# Patient Record
Sex: Male | Born: 1995 | Race: White | Hispanic: No | Marital: Single | Smoking: Former smoker
Health system: Southern US, Community
[De-identification: ages and names within clinical notes are randomized; demographics above are authoritative.]

## PROBLEM LIST (undated history)

## (undated) DIAGNOSIS — F909 Attention-deficit hyperactivity disorder, unspecified type: Secondary | ICD-10-CM

## (undated) DIAGNOSIS — R278 Other lack of coordination: Secondary | ICD-10-CM

## (undated) DIAGNOSIS — F88 Other disorders of psychological development: Secondary | ICD-10-CM

## (undated) DIAGNOSIS — IMO0001 Reserved for inherently not codable concepts without codable children: Secondary | ICD-10-CM

## (undated) DIAGNOSIS — T7840XA Allergy, unspecified, initial encounter: Secondary | ICD-10-CM

## (undated) DIAGNOSIS — F419 Anxiety disorder, unspecified: Secondary | ICD-10-CM

## (undated) HISTORY — DX: Other lack of coordination: R27.8

## (undated) HISTORY — DX: Allergy, unspecified, initial encounter: T78.40XA

## (undated) HISTORY — DX: Other disorders of psychological development: F88

## (undated) HISTORY — DX: Reserved for inherently not codable concepts without codable children: IMO0001

## (undated) HISTORY — DX: Attention-deficit hyperactivity disorder, unspecified type: F90.9

---
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
## 2017-07-28 ENCOUNTER — Encounter: Payer: Self-pay | Admitting: Family Medicine

## 2017-07-28 ENCOUNTER — Ambulatory Visit (INDEPENDENT_AMBULATORY_CARE_PROVIDER_SITE_OTHER): Payer: Medicaid Other | Admitting: Family Medicine

## 2017-07-28 DIAGNOSIS — M549 Dorsalgia, unspecified: Secondary | ICD-10-CM | POA: Diagnosis not present
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
## 2017-12-23 ENCOUNTER — Other Ambulatory Visit: Payer: Self-pay | Admitting: Family Medicine

## 2017-12-23 MED ORDER — OSELTAMIVIR PHOSPHATE 75 MG PO CAPS: 75.0000 mg | ORAL_CAPSULE | Freq: Every day | ORAL | 0 refills | Status: DC

## 2019-03-02 DIAGNOSIS — Z0389 Encounter for observation for other suspected diseases and conditions ruled out: Secondary | ICD-10-CM | POA: Diagnosis not present

## 2019-03-02 DIAGNOSIS — Z3009 Encounter for other general counseling and advice on contraception: Secondary | ICD-10-CM | POA: Diagnosis not present

## 2019-03-02 DIAGNOSIS — Z1388 Encounter for screening for disorder due to exposure to contaminants: Secondary | ICD-10-CM | POA: Diagnosis not present

## 2020-11-18 ENCOUNTER — Emergency Department (HOSPITAL_COMMUNITY)
Admission: EM | Admit: 2020-11-18 | Discharge: 2020-11-18 | Disposition: A | Discharge status: Home / Self Care | Payer: Medicaid Other | Attending: Emergency Medicine | Admitting: Emergency Medicine

## 2020-11-18 ENCOUNTER — Encounter (HOSPITAL_COMMUNITY): Payer: Self-pay | Admitting: Emergency Medicine

## 2020-11-18 ENCOUNTER — Other Ambulatory Visit: Payer: Self-pay

## 2020-11-18 DIAGNOSIS — R0789 Other chest pain: Secondary | ICD-10-CM | POA: Diagnosis not present

## 2020-11-18 DIAGNOSIS — J45909 Unspecified asthma, uncomplicated: Secondary | ICD-10-CM | POA: Diagnosis not present

## 2020-11-18 DIAGNOSIS — T887XXA Unspecified adverse effect of drug or medicament, initial encounter: Secondary | ICD-10-CM | POA: Diagnosis not present

## 2020-11-18 DIAGNOSIS — F17200 Nicotine dependence, unspecified, uncomplicated: Secondary | ICD-10-CM | POA: Diagnosis not present

## 2020-11-18 DIAGNOSIS — T50904A Poisoning by unspecified drugs, medicaments and biological substances, undetermined, initial encounter: Secondary | ICD-10-CM | POA: Diagnosis not present

## 2020-11-18 DIAGNOSIS — R002 Palpitations: Secondary | ICD-10-CM | POA: Diagnosis present

## 2020-11-18 DIAGNOSIS — F14129 Cocaine abuse with intoxication, unspecified: Secondary | ICD-10-CM | POA: Insufficient documentation

## 2020-11-18 DIAGNOSIS — R0689 Other abnormalities of breathing: Secondary | ICD-10-CM | POA: Diagnosis not present

## 2020-11-18 MED ORDER — LORAZEPAM 2 MG/ML IJ SOLN
1.0000 mg | Freq: Once | INTRAMUSCULAR | Status: AC
  Administered 2020-11-18: 1 mg via INTRAVENOUS
  Filled 2020-11-18: qty 1

## 2020-11-18 NOTE — ED Triage Notes (Signed)
[facility] - pt c/o tachycardia and emesis after snorting about 1g of cocaine today.

## 2020-11-18 NOTE — Discharge Instructions (Addendum)
You were evaluated in the Emergency Department and after careful evaluation, we did not find any emergent condition requiring admission or further testing in the hospital.  Your exam/testing today was overall reassuring.  Please return to the Emergency Department if you experience any worsening of your condition.  Thank you for allowing us to be a part of your care.  

## 2020-11-18 NOTE — ED Provider Notes (Signed)
[facility]-EMERGENCY DEPT [facility] Emergency Department Provider Note MRN:  [id number]  Arrival date & time: 11/18/20     Chief Complaint   Ingestion   History of Present Illness   Leonard Craig is a 24 y.o. year-old male with no pertinent past medical presenting to the ED with chief complaint of ingestion.  Patient explains that he used cocaine today and it is making him feel strange.  He used 1 g of cocaine from typical supplier, has been known to use more than 1 g.  Endorsing heart racing and nausea vomiting after use today, which is abnormal for him denies chest pain or shortness of breath.  Feels anxious.  Review of Systems  A complete 10 system review of systems was obtained and all systems are negative except as noted in the HPI and PMH.   Patient's Health History    Past Medical History:  Diagnosis Date  . ADHD (attention deficit hyperactivity disorder)   . Allergy   . Anxiety   . Asthma 03/28/2013   Pt. States resolved  . Dysgraphia   . Sensory integration disorder   . Speech-language therapy     History reviewed. No pertinent surgical history.  Family History  Problem Relation Age of Onset  . Alcohol abuse Mother   . Depression Mother   . Kidney disease Father   . Kidney disease Maternal Grandmother     Social History   Socioeconomic History  . Marital status: Single    Spouse name: Not on file  . Number of children: Not on file  . Years of education: Not on file  . Highest education level: Not on file  Occupational History  . Not on file  Tobacco Use  . Smoking status: Current Some Day Smoker  . Smokeless tobacco: Never Used  Substance and Sexual Activity  . Alcohol use: Yes  . Drug use: Yes    Types: Marijuana, Cocaine    Comment: QD  . Sexual activity: Never    Comment: states he was in the car with people who were "hot boxing"  Other Topics Concern  . Not on file  Social History Narrative  . Not on file   Social Determinants of Health     Financial Resource Strain:   . Difficulty of Paying Living Expenses: Not on file  Food Insecurity:   . Worried About Programme researcher, broadcasting/film/video in the Last Year: Not on file  . Ran Out of Food in the Last Year: Not on file  Transportation Needs:   . Lack of Transportation (Medical): Not on file  . Lack of Transportation (Non-Medical): Not on file  Physical Activity:   . Days of Exercise per Week: Not on file  . Minutes of Exercise per Session: Not on file  Stress:   . Feeling of Stress : Not on file  Social Connections:   . Frequency of Communication with Friends and Family: Not on file  . Frequency of Social Gatherings with Friends and Family: Not on file  . Attends Religious Services: Not on file  . Active Member of Clubs or Organizations: Not on file  . Attends Banker Meetings: Not on file  . Marital Status: Not on file  Intimate Partner Violence:   . Fear of Current or Ex-Partner: Not on file  . Emotionally Abused: Not on file  . Physically Abused: Not on file  . Sexually Abused: Not on file     Physical Exam   Vitals:  11/18/20 2157  BP: 140/88  Pulse: 88  Resp: 18  Temp: 98.4 F (36.9 C)  SpO2: 95%    CONSTITUTIONAL: Well-appearing, NAD NEURO:  Alert and oriented x 3, no focal deficits EYES:  eyes equal and reactive ENT/NECK:  no LAD, no JVD CARDIO: Regular rate, well-perfused, normal S1 and S2 PULM:  CTAB no wheezing or rhonchi GI/GU:  normal bowel sounds, non-distended, non-tender MSK/SPINE:  No gross deformities, no edema SKIN:  no rash, atraumatic PSYCH:  Appropriate speech and behavior  *Additional and/or pertinent findings included in MDM below  Diagnostic and Interventional Summary    EKG Interpretation  Date/Time:  Tuesday November 18 2020 22:40:52 EST Ventricular Rate:  81 PR Interval:  172 QRS Duration: 72 QT Interval:  390 QTC Calculation: 453 R Axis:   90 Text Interpretation:  Poor data quality, interpretation may be  adversely affected Sinus rhythm with marked sinus arrhythmia Rightward axis Anteroseptal infarct , age undetermined Abnormal ECG Confirmed by Kennis Carina [phone]) on 11/18/2020 11:05:09 PM      Labs Reviewed - No data to display  No orders to display    Medications  LORazepam (ATIVAN) injection 1 mg (1 mg Intravenous Given 11/18/20 2246)     Procedures  /  Critical Care Procedures  ED Course and Medical Decision Making  I have reviewed the triage vital signs, the nursing notes, and pertinent available records from the EMR.  Listed above are laboratory and imaging tests that I personally ordered, reviewed, and interpreted and then considered in my medical decision making (see below for details).  Patient is in no acute distress with normal vital signs after cocaine use.  Dilated pupils.  No chest pain or shortness of breath.  EKG is reassuring.  Patient feeling much better after IV Ativan.  Requesting discharge.  Doubt emergent process, appropriate for discharge.  Patient encouraged to refrain from cocaine use in the future.       Elmer Sow. Pilar Plate, MD [facility] Emergency Medicine [facility] [email or web address]  Final Clinical Impressions(s) / ED Diagnoses     ICD-10-CM   1. Cocaine abuse with intoxication (HCC)  F81.829     ED Discharge Orders    None       Discharge Instructions Discussed with and Provided to Patient:     Discharge Instructions     You were evaluated in the Emergency Department and after careful evaluation, we did not find any emergent condition requiring admission or further testing in the hospital.  Your exam/testing today was overall reassuring.  Please return to the Emergency Department if you experience any worsening of your condition.  Thank you for allowing [place] to be a part of your care.       Sabas Sous, MD 11/18/20 2340

## 2020-11-19 ENCOUNTER — Telehealth: Payer: Self-pay

## 2020-11-19 NOTE — Telephone Encounter (Signed)
Transition Care Management Unsuccessful Follow-up Telephone Call ° °Date of discharge and from where:  11/18/2020 [facility] ° °Attempts:  1st Attempt ° °Reason for unsuccessful TCM follow-up call:  Voice mail full ° ° ° °

## 2020-11-20 NOTE — Telephone Encounter (Signed)
Transition Care Management Unsuccessful Follow-up Telephone Call  Date of discharge and from where:  11/18/2020 [facility]  Attempts:  2nd Attempt  Reason for unsuccessful TCM follow-up call:  Voice mail full

## 2020-11-21 NOTE — Telephone Encounter (Signed)
Transition Care Management Unsuccessful Follow-up Telephone Call  Date of discharge and from where:  11/18/2020 [facility]  Attempts:  3rd Attempt  Reason for unsuccessful TCM follow-up call:  Voice mail full

## 2021-04-02 ENCOUNTER — Other Ambulatory Visit: Payer: Self-pay

## 2021-04-02 ENCOUNTER — Ambulatory Visit: Payer: Medicaid Other | Admitting: Family Medicine

## 2021-04-02 ENCOUNTER — Encounter: Payer: Self-pay | Admitting: Family Medicine

## 2021-04-02 VITALS — BP 122/84 | HR 85 | Temp 98.6°F | Ht 69.0 in | Wt 178.4 lb

## 2021-04-02 DIAGNOSIS — F902 Attention-deficit hyperactivity disorder, combined type: Secondary | ICD-10-CM | POA: Diagnosis not present

## 2021-04-02 DIAGNOSIS — Z0000 Encounter for general adult medical examination without abnormal findings: Secondary | ICD-10-CM | POA: Diagnosis not present

## 2021-04-02 DIAGNOSIS — M940 Chondrocostal junction syndrome [Tietze]: Secondary | ICD-10-CM | POA: Diagnosis not present

## 2021-04-02 DIAGNOSIS — Z2821 Immunization not carried out because of patient refusal: Secondary | ICD-10-CM | POA: Diagnosis not present

## 2021-04-02 DIAGNOSIS — F1210 Cannabis abuse, uncomplicated: Secondary | ICD-10-CM

## 2021-04-02 DIAGNOSIS — G43009 Migraine without aura, not intractable, without status migrainosus: Secondary | ICD-10-CM

## 2021-04-02 DIAGNOSIS — F325 Major depressive disorder, single episode, in full remission: Secondary | ICD-10-CM

## 2021-04-02 NOTE — Progress Notes (Signed)
   Subjective:    Patient ID: Leonard Craig, male    DOB: 06/04/1996, 25 y.o.   MRN: [id number]  HPI He is here for complete examination.  He does complain of a 3-week history of right-sided costal chondral pain he states today is much better.  No history of injury to that area or overuse of the arms chest or back.  He also has a history of cocaine abuse but states he is no longer using this but has switched to marijuana.  He has not gotten involved in AA. he does have a previous history of depression and has been on Remeron and Prozac however is not taking it at present time.  He states that psychologically he feels quite good.  He is in a stable relationship.  He does not drink and does not smoke cigarettes.  Does have a previous history of ADHD as well as migraine headaches but is not on any medications or had difficulty with migraines.  Family and social history as well as health maintenance and immunizations was reviewed  Review of Systems  All other systems reviewed and are negative.      Objective:   Physical Exam Alert and in no distress. Tympanic membranes and canals are normal. Pharyngeal area is normal. Neck is supple without adenopathy or thyromegaly. Cardiac exam shows a regular sinus rhythm without murmurs or gallops. Lungs are clear to auscultation. Abdominal exam shows no masses or tenderness with normal bowel sounds.  Genitalia shows normal circumcised male testes normal.       Assessment & Plan:  Routine general medical examination at a health care facility  Costochondritis, acute  Migraine without aura and without status migrainosus, not intractable  ADHD (attention deficit hyperactivity disorder), combined type  Cannabis abuse  Major depression in remission [facility])  Immunization refused Covid vaccination was recommended however he refused.  He also did not want any blood work drawn.  No intervention needed for the migraine or ADHD. For treatment of a  costochondritis I mentioned using heat for 20 minutes 3 times per day,, 2 Aleve twice per day for the next 10 days. I then discussed his use of marijuana and the fact that he is using it for mind altering properties.  Strongly encouraged him to get involved in counseling to deal with the underlying issues as to why he is using the marijuana.  phone number for [place] [facility] was given.

## 2023-04-27 ENCOUNTER — Telehealth: Payer: Self-pay

## 2023-04-27 NOTE — Telephone Encounter (Signed)
LVM for patient to call back. AS, CMA 

## 2023-11-11 ENCOUNTER — Ambulatory Visit
Admission: RE | Admit: 2023-11-11 | Discharge: 2023-11-11 | Disposition: A | Discharge status: Home / Self Care | Payer: Medicaid Other | Source: Ambulatory Visit | Attending: Medical | Admitting: Medical

## 2023-11-11 ENCOUNTER — Ambulatory Visit (INDEPENDENT_AMBULATORY_CARE_PROVIDER_SITE_OTHER): Payer: Medicaid Other | Admitting: Medical

## 2023-11-11 ENCOUNTER — Encounter: Payer: Self-pay | Admitting: Medical

## 2023-11-11 VITALS — BP 120/70 | HR 77 | Temp 97.8°F | Wt 188.8 lb

## 2023-11-11 DIAGNOSIS — M5442 Lumbago with sciatica, left side: Secondary | ICD-10-CM

## 2023-11-11 DIAGNOSIS — M5410 Radiculopathy, site unspecified: Secondary | ICD-10-CM

## 2023-11-11 DIAGNOSIS — R109 Unspecified abdominal pain: Secondary | ICD-10-CM | POA: Diagnosis not present

## 2023-11-11 DIAGNOSIS — M51361 Other intervertebral disc degeneration, lumbar region with lower extremity pain only: Secondary | ICD-10-CM | POA: Diagnosis not present

## 2023-11-11 DIAGNOSIS — N50819 Testicular pain, unspecified: Secondary | ICD-10-CM | POA: Diagnosis not present

## 2023-11-11 DIAGNOSIS — R1030 Lower abdominal pain, unspecified: Secondary | ICD-10-CM | POA: Diagnosis not present

## 2023-11-11 MED ORDER — OXYCODONE-ACETAMINOPHEN 5-325 MG PO TABS: 1.0000 | ORAL_TABLET | Freq: Three times a day (TID) | ORAL | 0 refills | Status: DC | PRN

## 2023-11-11 MED ORDER — PREDNISONE 10 MG PO TABS: ORAL_TABLET | ORAL | 0 refills | Status: DC

## 2023-11-11 MED ORDER — DIAZEPAM 5 MG PO TABS: 5.0000 mg | ORAL_TABLET | Freq: Two times a day (BID) | ORAL | 0 refills | Status: DC | PRN

## 2023-11-11 NOTE — Patient Instructions (Signed)
Recommendations: Go for x-ray today Begin prednisone steroid taper as directed on the label over the next week to reduce pain and inflammation Begin Percocet pain medication 1 tablet every 6-8 hours as needed over the weekend for pain.  Caution this can cause drowsiness You could also use Valium for muscle relaxer up to twice a day through the weekend but caution as this can also cause sedation.  Try not to take this at the same time as the pain medication.  Alternate at least 4 hours. Use the urine strainer and urine cup to strain your urine in the event there could be a kidney stone.  If you get stone debris put in the urine cup and plan to bring it in next week Over the weekend if you are way worse despite the medication, severe pain, fever, blood in the urine, uncontrollable vomiting, cannot walk, numbness in the genitals, then go to the emergency department.   Please go to [facility] for your back and KUB xray.   Their hours are 8am - 4:30 pm Monday - Friday.  Take your insurance card with you.  [facility] [phone]   [address] [place], [place] [address]

## 2023-11-11 NOTE — Progress Notes (Signed)
Subjective:  Leonard Craig is a 27 y.o. male who presents for Chief Complaint  Patient presents with   Back Pain    Back pain, lower back and love handles. Doing something the other day and started back spasms- and back pain is radiating down into left butt cheek and into testicles.      Here for back pain.  Having unusual pains in low back.    Has hx/o back pain.  Works in [employer] moving in general, so has pains on a regular basis.   His new paint radiates down into testicles and into left thigh.  Started 5 days ago.  This pain is new/worse than usual pains he gets on the job.  Has also been having back spasms in upper back.  Last night couldn't sleep due to the pain.    No recent activity out of the ordinary, so no reason to have worse pain.  He notes last night had nausea, felt like he had to use bathroom, tried to have BM, nothing would happen.  Started having bad gas last night as well, despite light dinner.  No urinary changes, no frequency or urgent, no penile discharge, no blood in urine.   Has had some occasional blood in stool he attributes to hemorrhoids.  Has had some testicular pain in past but not frequent  Has girlfriend, been together 11 years, no concern for STD.  No prior prostate or urinary infection.  Has been using Tylenol, Advil, heat, cold therapy, lidocaine cream.     Tried stretching.  No numbness in genital.    No other aggravating or relieving factors.    No other c/o.  Past Medical History:  Diagnosis Date   ADHD (attention deficit hyperactivity disorder)    Allergy    Anxiety    Asthma 03/28/2013   Pt. States resolved   Dysgraphia    Sensory integration disorder    Speech-language therapy    No current outpatient medications on file prior to visit.   No current facility-administered medications on file prior to visit.     The following portions of the patient's history were reviewed and updated as appropriate: allergies, current  medications, past family history, past medical history, past social history, past surgical history and problem list.  ROS Otherwise as in subjective above    Objective: BP 120/70   Pulse 77   Temp 97.8 F (36.6 C)   Wt 188 lb 12.8 oz (85.6 kg)   BMI 27.88 kg/m   General appearance: alert,, well developed, well nourished, seems somewhat distressed in pain Abdomen, soft, left lower and left mid abdominal tenderness, no mass, no  hepatomegaly, no splenomegaly Back: Tender lumbar paraspinal bilateral and midline lumbar region, range of motion about 80 degrees with flexion 10 degrees extension but in pain, no obvious swelling or discoloration MSK: Left hip internal range of motion elicits some pain in the back, straight leg raise positive on the left, both legs in the spasm with straight leg raise Strength normal but seems a little blunted 4-5 out of 5 with both legs, DTRs normal, sensation normal GU: Normal male, circumcised, tender the left testicle but no swelling or deformity, no hernia, no lymphadenopathy Pulses: 2+ radial pulses, 2+ pedal pulses, normal cap refill Ext: no edema   Assessment: Encounter Diagnoses  Name Primary?   Left-sided low back pain with left-sided sciatica, unspecified chronicity Yes   Lower abdominal pain    Pain in testicle, unspecified laterality  Radicular pain of left lower extremity      Plan: We discussed symptoms, possible differential which could include ruptured disc, radicular pain from sciatica, versus kidney stone or other.  Discussed the following recommendations.  Discussed red flag symptoms over the weekend that would prompt going to the emergency department  Patient Instructions  Recommendations: Go for x-ray today Begin prednisone steroid taper as directed on the label over the next week to reduce pain and inflammation Begin Percocet pain medication 1 tablet every 6-8 hours as needed over the weekend for pain.  Caution this can  cause drowsiness You could also use Valium for muscle relaxer up to twice a day through the weekend but caution as this can also cause sedation.  Try not to take this at the same time as the pain medication.  Alternate at least 4 hours. Use the urine strainer and urine cup to strain your urine in the event there could be a kidney stone.  If you get stone debris put in the urine cup and plan to bring it in next week Over the weekend if you are way worse despite the medication, severe pain, fever, blood in the urine, uncontrollable vomiting, cannot walk, numbness in the genitals, then go to the emergency department.   Please go to [facility] for your back and KUB xray.   Their hours are 8am - 4:30 pm Monday - Friday.  Take your insurance card with you.  [facility] [phone]   [address] [place], [place] [address]   [name]" was seen today for back pain.  Diagnoses and all orders for this visit:  Left-sided low back pain with left-sided sciatica, unspecified chronicity -     DG Lumbar Spine Complete -     DG Abd 1 View  Lower abdominal pain -     DG Lumbar Spine Complete -     DG Abd 1 View  Pain in testicle, unspecified laterality -     DG Lumbar Spine Complete -     DG Abd 1 View  Radicular pain of left lower extremity -     DG Lumbar Spine Complete -     DG Abd 1 View  Other orders -     oxyCODONE-acetaminophen (PERCOCET/ROXICET) 5-325 MG tablet; Take 1 tablet by mouth every 8 (eight) hours as needed for up to 5 days for severe pain (pain score 7-10). -     predniSONE (DELTASONE) 10 MG tablet; 6 tablets all together day 1, 5 tablets day 2, 4 tablets day 3, 3 tablets day 4, 2 tablets day 5, 1 tablet day 6. -     diazepam (VALIUM) 5 MG tablet; Take 1 tablet (5 mg total) by mouth every 12 (twelve) hours as needed for anxiety.    Follow up: pending xray

## 2023-11-11 NOTE — Progress Notes (Signed)
X-ray shows no obvious kidney stone.  Abdomen x-ray otherwise normal  Back x-ray shows mild degenerative disc disease but also some disc height loss at L1-L2.  This could be related to bulging disc  Use the medication of the weekend, rest, hydrate well, try to find a comfortable position in the bed.  Do some mild stretching and gentle range of motion as symptoms improve  Plan on rechecking next week towards the end of the week  If symptoms are still quite persistent over the next week or 2, we may need to get an MRI or possibly get physical therapy involved

## 2023-11-14 ENCOUNTER — Telehealth: Payer: Self-pay | Admitting: Family Medicine

## 2023-11-14 ENCOUNTER — Other Ambulatory Visit: Payer: Self-pay | Admitting: Medical

## 2023-11-14 DIAGNOSIS — M5410 Radiculopathy, site unspecified: Secondary | ICD-10-CM

## 2023-11-14 DIAGNOSIS — M5441 Lumbago with sciatica, right side: Secondary | ICD-10-CM

## 2023-11-14 DIAGNOSIS — R202 Paresthesia of skin: Secondary | ICD-10-CM

## 2023-11-14 NOTE — Telephone Encounter (Signed)
Pt called & advised back pain a little better thinks due to prednisone & pain med, but left leg has sharp pain more than before & leg still numb & he still has testicle pain. He's also still having a hard time walking due to pain. He wants to know if you recommend he go ahead with the MRI?

## 2023-11-15 ENCOUNTER — Other Ambulatory Visit: Payer: Self-pay | Admitting: Medical

## 2023-11-15 ENCOUNTER — Telehealth: Payer: Self-pay | Admitting: Family Medicine

## 2023-11-15 MED ORDER — HYDROCODONE-ACETAMINOPHEN 7.5-325 MG PO TABS: 1.0000 | ORAL_TABLET | Freq: Two times a day (BID) | ORAL | 0 refills | Status: AC | PRN

## 2023-11-15 NOTE — Telephone Encounter (Signed)
Pt called & states pain med needs ins approval.  Called pharmacy, they faxed P.A. I completed P.A. Hydrocodone/acetaminophen

## 2023-11-16 NOTE — Telephone Encounter (Signed)
P.A. approved til 05/14/24, pt informed, called pharmacy went thru for $4

## 2023-11-19 NOTE — Telephone Encounter (Signed)
Pt was informed.

## 2023-11-22 ENCOUNTER — Ambulatory Visit (HOSPITAL_COMMUNITY): Payer: Medicaid Other | Attending: Medical

## 2024-02-14 ENCOUNTER — Encounter: Payer: Self-pay | Admitting: Internal Medicine

## 2025-01-10 ENCOUNTER — Encounter: Payer: Self-pay | Admitting: Nurse Practitioner

## 2025-01-10 ENCOUNTER — Ambulatory Visit: Admitting: Nurse Practitioner

## 2025-01-10 VITALS — BP 132/84 | HR 60 | Wt 175.8 lb

## 2025-01-10 DIAGNOSIS — K5909 Other constipation: Secondary | ICD-10-CM

## 2025-01-10 DIAGNOSIS — S335XXA Sprain of ligaments of lumbar spine, initial encounter: Secondary | ICD-10-CM

## 2025-01-10 MED ORDER — PREDNISONE 20 MG PO TABS: 40.0000 mg | ORAL_TABLET | Freq: Every day | ORAL | 0 refills | Status: AC

## 2025-01-10 MED ORDER — CYCLOBENZAPRINE HCL 10 MG PO TABS: 10.0000 mg | ORAL_TABLET | Freq: Three times a day (TID) | ORAL | 0 refills | Status: AC | PRN

## 2025-01-10 NOTE — Patient Instructions (Addendum)
 You can try something like Dulcolax to help stimulate a bowel movement.  Miralax can help to make bowel movements soft and easy to pass, but wont necessarily make you go immediately. These are all available in the pharmacy over the counter.   Low Back Sprain or Strain Rehab Ask your health care provider which exercises are safe for you. Do exercises exactly as told by your health care provider and adjust them as directed. It is normal to feel mild stretching, pulling, tightness, or discomfort as you do these exercises. Stop right away if you feel sudden pain or your pain gets worse. Do not begin these exercises until told by your health care provider. Stretching and range-of-motion exercises These exercises warm up your muscles and joints and improve the movement and flexibility of your back. These exercises also help to relieve pain, numbness, and tingling. Lumbar rotation  Lie on your back on a firm bed or the floor with your knees bent. Straighten your arms out to your sides so each arm forms a 90-degree angle (right angle) with a side of your body. Slowly move (rotate) both of your knees to one side of your body until you feel a stretch in your lower back (lumbar). Try not to let your shoulders lift off the floor. Hold this position for _____5-10_____ seconds. Tense your abdominal muscles and slowly move your knees back to the starting position. Repeat this exercise on the other side of your body. Repeat ____2-3______ times. Complete this exercise ____2-3______ times a day. (Same for all exercises) Single knee to chest  Lie on your back on a firm bed or the floor with both legs straight. Bend one of your knees. Use your hands to move your knee up toward your chest until you feel a gentle stretch in your lower back and buttock. Hold your leg in this position by holding on to the front of your knee. Keep your other leg as straight as possible. Hold this position for __________  seconds. Slowly return to the starting position. Repeat with your other leg. Repeat __________ times. Complete this exercise __________ times a day. Prone extension on elbows  Lie on your abdomen on a firm bed or the floor (prone position). Prop yourself up on your elbows. Use your arms to help lift your chest up until you feel a gentle stretch in your abdomen and your lower back. This will place some of your body weight on your elbows. If this is uncomfortable, try stacking pillows under your chest. Your hips should stay down, against the surface that you are lying on. Keep your hip and back muscles relaxed. Hold this position for __________ seconds. Slowly relax your upper body and return to the starting position. Repeat __________ times. Complete this exercise __________ times a day. Strengthening exercises These exercises build strength and endurance in your back. Endurance is the ability to use your muscles for a long time, even after they get tired. Pelvic tilt This exercise strengthens the muscles that lie deep in the abdomen. Lie on your back on a firm bed or the floor with your legs extended. Bend your knees so they are pointing toward the ceiling and your feet are flat on the floor. Tighten your lower abdominal muscles to press your lower back against the floor. This motion will tilt your pelvis so your tailbone points up toward the ceiling instead of pointing to your feet or the floor. To help with this exercise, you may place a small towel under  your lower back and try to push your back into the towel. Hold this position for __________ seconds. Let your muscles relax completely before you repeat this exercise. Repeat __________ times. Complete this exercise __________ times a day. Alternating arm and leg raises  Get on your hands and knees on a firm surface. If you are on a hard floor, you may want to use padding, such as an exercise mat, to cushion your knees. Line up your  arms and legs. Your hands should be directly below your shoulders, and your knees should be directly below your hips. Lift your left leg behind you. At the same time, raise your right arm and straighten it in front of you. Do not lift your leg higher than your hip. Do not lift your arm higher than your shoulder. Keep your abdominal and back muscles tight. Keep your hips facing the ground. Do not arch your back. Keep your balance carefully, and do not hold your breath. Hold this position for __________ seconds. Slowly return to the starting position. Repeat with your right leg and your left arm. Repeat __________ times. Complete this exercise __________ times a day. Abdominal set with straight leg raise  Lie on your back on a firm bed or the floor. Bend one of your knees and keep your other leg straight. Tense your abdominal muscles and lift your straight leg up, 4-6 inches (10-15 cm) off the ground. Keep your abdominal muscles tight and hold this position for __________ seconds. Do not hold your breath. Do not arch your back. Keep it flat against the ground. Keep your abdominal muscles tense as you slowly lower your leg back to the starting position. Repeat with your other leg. Repeat __________ times. Complete this exercise __________ times a day. Single leg lower with bent knees Lie on your back on a firm bed or the floor. Tense your abdominal muscles and lift your feet off the floor, one foot at a time, so your knees and hips are bent in 90-degree angles (right angles). Your knees should be over your hips and your lower legs should be parallel to the floor. Keeping your abdominal muscles tense and your knee bent, slowly lower one of your legs so your toe touches the ground. Lift your leg back up to return to the starting position. Do not hold your breath. Do not let your back arch. Keep your back flat against the ground. Repeat with your other leg. Repeat __________ times. Complete  this exercise __________ times a day. Posture and body mechanics Good posture and healthy body mechanics can help to relieve stress in your body's tissues and joints. Body mechanics refers to the movements and positions of your body while you do your daily activities. Posture is part of body mechanics. Good posture means: Your spine is in its natural S-curve position (neutral). Your shoulders are pulled back slightly. Your head is not tipped forward (neutral). Follow these guidelines to improve your posture and body mechanics in your everyday activities. Standing  When standing, keep your spine neutral and your feet about hip-width apart. Keep a slight bend in your knees. Your ears, shoulders, and hips should line up. When you do a task in which you stand in one place for a long time, place one foot up on a stable object that is 2-4 inches (5-10 cm) high, such as a footstool. This helps keep your spine neutral. Sitting  When sitting, keep your spine neutral and keep your feet flat on the floor.  Use a footrest, if necessary, and keep your thighs parallel to the floor. Avoid rounding your shoulders, and avoid tilting your head forward. When working at a desk or a computer, keep your desk at a height where your hands are slightly lower than your elbows. Slide your chair under your desk so you are close enough to maintain good posture. When working at a computer, place your monitor at a height where you are looking straight ahead and you do not have to tilt your head forward or downward to look at the screen. Resting When lying down and resting, avoid positions that are most painful for you. If you have pain with activities such as sitting, bending, stooping, or squatting, lie in a position in which your body does not bend very much. For example, avoid curling up on your side with your arms and knees near your chest (fetal position). If you have pain with activities such as standing for a long time or  reaching with your arms, lie with your spine in a neutral position and bend your knees slightly. Try the following positions: Lying on your side with a pillow between your knees. Lying on your back with a pillow under your knees. Lifting  When lifting objects, keep your feet at least shoulder-width apart and tighten your abdominal muscles. Bend your knees and hips and keep your spine neutral. It is important to lift using the strength of your legs, not your back. Do not lock your knees straight out. Always ask for help to lift heavy or awkward objects. This information is not intended to replace advice given to you by your health care provider. Make sure you discuss any questions you have with your health care provider. Document Revised: 04/11/2023 Document Reviewed: 02/23/2021 Elsevier Patient Education  2024 [employer].

## 2025-01-10 NOTE — Progress Notes (Signed)
 "  [name] Doing, DNP, AGNP-c [facility] [address] [place], [place] [address] [phone]   ACUTE VISIT : Acute or New Concern Visit on 01/10/2025  Blood pressure 132/84, pulse 60, weight 175 lb 12.8 oz (79.7 kg).   Subjective:  other (Hurt back, 3-4 days ago was getting ready for had tightness in the back, started getting worse at work so left work, pain is really bad today, lower back pain and butt area, taking ibuprofen, lidocaine patches, and Tylenol , needs a doctor's note for missing work, )  History of Present Illness Leonard Craig is a 29 year old male who presents with lower back pain after a work-related injury.  He has been experiencing lower back pain for three to four days, which began while preparing for work. As an employee of a moving company, he frequently engages in heavy lifting. The pain started as slight pressure while putting on shoes and worsened significantly during a third-floor move, resulting in a 'locked up' sensation. Despite taking a break and resting for the remainder of the day and the following day, the pain persisted.  The pain is localized to the lower back, extending from the middle down to the top of the buttocks, with more severity on the left side. He describes severe spasms, particularly when attempting normal movements like bending forward, which have been debilitating. These spasms have caused difficulty with standing and even necessitated crawling on the floor to use the bathroom. He experienced a spasm while driving, which was severe enough to 'take his breath away'.  He reports a history of back issues, which he recalls were previously attributed to the sciatic nerve by another clinician.  He reports his current symptoms are not causing radiating pain or l bowel or bladder dysfunction.  He has been using a heating pad, which provides some relief, and has tried methocarbamol, given by his brother who has Pick's disease, but found  it ineffective. He has also been taking Tylenol  and ibuprofen without significant relief.  He has not had a bowel movement in about three days, attributing this to the difficulty in bearing down due to the back pain. However, he is able to urinate without issue.  He has missed work on January 20th and 22nd due to the pain and is seeking a note to excuse him from work until the following Monday. ROS negative except for what is listed in HPI. History, Medications, Surgery, SDOH, and Family History reviewed and updated as appropriate.  Objective:  Physical Exam Vitals and nursing note reviewed.  Constitutional:      General: He is in acute distress.  Musculoskeletal:        General: Tenderness present.     Lumbar back: Spasms and tenderness present. Decreased range of motion.  Skin:    Capillary Refill: Capillary refill takes less than 2 seconds.  Neurological:     Mental Status: He is alert and oriented to person, place, and time.     Sensory: No sensory deficit.     Motor: Weakness present.     Coordination: Coordination normal.     Gait: Gait abnormal.         Assessment & Plan:   Assessment & Plan Lumbar sprain, initial encounter Acute lumbar sprain with muscle spasm, primarily affecting the left side, exacerbated by physical activity. Symptoms include severe muscle spasms and difficulty with movement. Previous use of methocarbamol was ineffective. Current management includes heat application, which provides relief.  Symptoms strongly suggestive  of muscular origin with no neurogenic involvement noted.  Recommend prednisone  to aid in inflammation.  Recommend rest and heat application for the next 24 hours then begin with gentle stretching exercises provided.  If symptoms worsen or fail to improve by Monday imaging may be required.  Will write patient out of work for the remainder of the week. - Prescribed cyclobenzaprine  for muscle relaxation, cautioned about potential drowsiness. -  Prescribed a 5-day burst of prednisone  for inflammation, advised taking in the morning due to potential energizing effects. - Advised rest and heat application for symptom relief. - Provided work note for absence from January 22nd to January 27th, with option to extend if symptoms persist. - Instructed on gentle stretching once muscle spasms subside. - Advised to contact if symptoms do not improve by Monday for potential imaging. Orders:   predniSONE  (DELTASONE ) 20 MG tablet; Take 2 tablets (40 mg total) by mouth daily with breakfast.   cyclobenzaprine  (FLEXERIL ) 10 MG tablet; Take 1 tablet (10 mg total) by mouth 3 (three) times daily as needed for muscle spasms.  Other constipation Likely secondary to lumbar sprain and associated discomfort, with no bowel movement for three days. Difficulty with bowel movements due to lower back pain and inability to bear down comfortably.  No bladder dysfunction or alarm symptoms present at this time. - Recommended Dulcolax for immediate relief of constipation. - Suggested Miralax for future use to soften stools and ease passage.      Castiel Lauricella E Ozzie Remmers, DNP, AGNP-c       "
